# Patient Record
Sex: Female | Born: 1970
Health system: Southern US, Community
[De-identification: ages and names within clinical notes are randomized; demographics above are authoritative.]

## PROBLEM LIST (undated history)

## (undated) DIAGNOSIS — B20 Human immunodeficiency virus [HIV] disease: Secondary | ICD-10-CM

## (undated) DIAGNOSIS — N19 Unspecified kidney failure: Secondary | ICD-10-CM

## (undated) DIAGNOSIS — I82409 Acute embolism and thrombosis of unspecified deep veins of unspecified lower extremity: Secondary | ICD-10-CM

## (undated) DIAGNOSIS — I10 Essential (primary) hypertension: Secondary | ICD-10-CM

## (undated) HISTORY — PX: KIDNEY TRANSPLANT: SHX239

## (undated) HISTORY — PX: SKIN GRAFT: SHX250

---

## 2014-08-21 ENCOUNTER — Encounter (HOSPITAL_BASED_OUTPATIENT_CLINIC_OR_DEPARTMENT_OTHER): Payer: Self-pay | Admitting: *Deleted

## 2014-08-21 ENCOUNTER — Emergency Department (HOSPITAL_BASED_OUTPATIENT_CLINIC_OR_DEPARTMENT_OTHER): Payer: No Typology Code available for payment source

## 2014-08-21 ENCOUNTER — Emergency Department (HOSPITAL_BASED_OUTPATIENT_CLINIC_OR_DEPARTMENT_OTHER)
Admission: EM | Admit: 2014-08-21 | Discharge: 2014-08-21 | Disposition: A | Discharge status: Home / Self Care | Payer: No Typology Code available for payment source | Attending: Emergency Medicine | Admitting: Emergency Medicine

## 2014-08-21 DIAGNOSIS — Z87448 Personal history of other diseases of urinary system: Secondary | ICD-10-CM | POA: Insufficient documentation

## 2014-08-21 DIAGNOSIS — S6991XA Unspecified injury of right wrist, hand and finger(s), initial encounter: Secondary | ICD-10-CM | POA: Insufficient documentation

## 2014-08-21 DIAGNOSIS — Y998 Other external cause status: Secondary | ICD-10-CM | POA: Diagnosis not present

## 2014-08-21 DIAGNOSIS — S4991XA Unspecified injury of right shoulder and upper arm, initial encounter: Secondary | ICD-10-CM | POA: Insufficient documentation

## 2014-08-21 DIAGNOSIS — Y92410 Unspecified street and highway as the place of occurrence of the external cause: Secondary | ICD-10-CM | POA: Insufficient documentation

## 2014-08-21 DIAGNOSIS — Z79899 Other long term (current) drug therapy: Secondary | ICD-10-CM | POA: Insufficient documentation

## 2014-08-21 DIAGNOSIS — T148 Other injury of unspecified body region: Secondary | ICD-10-CM | POA: Insufficient documentation

## 2014-08-21 DIAGNOSIS — Y9389 Activity, other specified: Secondary | ICD-10-CM | POA: Diagnosis not present

## 2014-08-21 DIAGNOSIS — Z880 Allergy status to penicillin: Secondary | ICD-10-CM | POA: Insufficient documentation

## 2014-08-21 DIAGNOSIS — Z21 Asymptomatic human immunodeficiency virus [HIV] infection status: Secondary | ICD-10-CM | POA: Insufficient documentation

## 2014-08-21 DIAGNOSIS — I10 Essential (primary) hypertension: Secondary | ICD-10-CM | POA: Insufficient documentation

## 2014-08-21 DIAGNOSIS — T148XXA Other injury of unspecified body region, initial encounter: Secondary | ICD-10-CM

## 2014-08-21 DIAGNOSIS — S299XXA Unspecified injury of thorax, initial encounter: Secondary | ICD-10-CM | POA: Diagnosis present

## 2014-08-21 HISTORY — DX: Unspecified kidney failure: N19

## 2014-08-21 HISTORY — DX: Human immunodeficiency virus (HIV) disease: B20

## 2014-08-21 HISTORY — DX: Essential (primary) hypertension: I10

## 2014-08-21 MED ORDER — ACETAMINOPHEN 325 MG PO TABS
ORAL_TABLET | ORAL | Status: AC
  Administered 2014-08-21: 650 mg via ORAL
  Filled 2014-08-21: qty 2

## 2014-08-21 MED ORDER — TRAMADOL HCL 50 MG PO TABS: 50.0000 mg | ORAL_TABLET | Freq: Four times a day (QID) | ORAL | Status: DC | PRN

## 2014-08-21 MED ORDER — METHOCARBAMOL 500 MG PO TABS
1000.0000 mg | ORAL_TABLET | Freq: Once | ORAL | Status: AC
  Administered 2014-08-21: 1000 mg via ORAL
  Filled 2014-08-21: qty 2

## 2014-08-21 MED ORDER — NAPROXEN 250 MG PO TABS
500.0000 mg | ORAL_TABLET | Freq: Once | ORAL | Status: DC
  Filled 2014-08-21: qty 2

## 2014-08-21 MED ORDER — ACETAMINOPHEN 325 MG PO TABS
650.0000 mg | ORAL_TABLET | Freq: Once | ORAL | Status: AC
  Administered 2014-08-21: 650 mg via ORAL

## 2014-08-21 MED ORDER — METHOCARBAMOL 500 MG PO TABS: 500.0000 mg | ORAL_TABLET | Freq: Two times a day (BID) | ORAL | Status: DC
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
## 2014-08-21 NOTE — Discharge Instructions (Signed)
Cryotherapy °Cryotherapy means treatment with cold. Ice or gel packs can be used to reduce both pain and swelling. Ice is the most helpful within the first 24 to 48 hours after an injury or flare-up from overusing a muscle or joint. Sprains, strains, spasms, burning pain, shooting pain, and aches can all be eased with ice. Ice can also be used when recovering from surgery. Ice is effective, has very few side effects, and is safe for most people to use. °PRECAUTIONS  °Ice is not a safe treatment option for people with: °· Raynaud phenomenon. This is a condition affecting small blood vessels in the extremities. Exposure to cold may cause your problems to return. °· Cold hypersensitivity. There are many forms of cold hypersensitivity, including: °¨ Cold urticaria. Red, itchy hives appear on the skin when the tissues begin to warm after being iced. °¨ Cold erythema. This is a red, itchy rash caused by exposure to cold. °¨ Cold hemoglobinuria. Red blood cells break down when the tissues begin to warm after being iced. The hemoglobin that carry oxygen are passed into the urine because they cannot combine with blood proteins fast enough. °· Numbness or altered sensitivity in the area being iced. °If you have any of the following conditions, do not use ice until you have discussed cryotherapy with your caregiver: °· Heart conditions, such as arrhythmia, angina, or chronic heart disease. °· High blood pressure. °· Healing wounds or open skin in the area being iced. °· Current infections. °· Rheumatoid arthritis. °· Poor circulation. °· Diabetes. °Ice slows the blood flow in the region it is applied. This is beneficial when trying to stop inflamed tissues from spreading irritating chemicals to surrounding tissues. However, if you expose your skin to cold temperatures for too long or without the proper protection, you can damage your skin or nerves. Watch for signs of skin damage due to cold. °HOME CARE INSTRUCTIONS °Follow  these tips to use ice and cold packs safely. °· Place a dry or damp towel between the ice and skin. A damp towel will cool the skin more quickly, so you may need to shorten the time that the ice is used. °· For a more rapid response, add gentle compression to the ice. °· Ice for no more than 10 to 20 minutes at a time. The bonier the area you are icing, the less time it will take to get the benefits of ice. °· Check your skin after 5 minutes to make sure there are no signs of a poor response to cold or skin damage. °· Rest 20 minutes or more between uses. °· Once your skin is numb, you can end your treatment. You can test numbness by very lightly touching your skin. The touch should be so light that you do not see the skin dimple from the pressure of your fingertip. When using ice, most people will feel these normal sensations in this order: cold, burning, aching, and numbness. °· Do not use ice on someone who cannot communicate their responses to pain, such as small children or people with dementia. °HOW TO MAKE AN ICE PACK °Ice packs are the most common way to use ice therapy. Other methods include ice massage, ice baths, and cryosprays. Muscle creams that cause a cold, tingly feeling do not offer the same benefits that ice offers and should not be used as a substitute unless recommended by your caregiver. °To make an ice pack, do one of the following: °· Place crushed ice or a   bag of frozen vegetables in a sealable plastic bag. Squeeze out the excess air. Place this bag inside another plastic bag. Slide the bag into a pillowcase or place a damp towel between your skin and the bag. °· Mix 3 parts water with 1 part rubbing alcohol. Freeze the mixture in a sealable plastic bag. When you remove the mixture from the freezer, it will be slushy. Squeeze out the excess air. Place this bag inside another plastic bag. Slide the bag into a pillowcase or place a damp towel between your skin and the bag. °SEEK MEDICAL CARE  IF: °· You develop white spots on your skin. This may give the skin a blotchy (mottled) appearance. °· Your skin turns blue or pale. °· Your skin becomes waxy or hard. °· Your swelling gets worse. °MAKE SURE YOU:  °· Understand these instructions. °· Will watch your condition. °· Will get help right away if you are not doing well or get worse. °Document Released: 10/06/2010 Document Revised: 06/26/2013 Document Reviewed: 10/06/2010 °ExitCare® Patient Information ©2015 [employer]. This information is not intended to replace advice given to you by your health care provider. Make sure you discuss any questions you have with your health care provider. ° °

## 2014-10-02 ENCOUNTER — Ambulatory Visit: Payer: No Typology Code available for payment source | Attending: Chiropractic Medicine | Admitting: Physical Therapy

## 2014-10-02 DIAGNOSIS — M545 Low back pain, unspecified: Secondary | ICD-10-CM
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
## 2014-10-02 NOTE — Patient Instructions (Signed)
Piriformis Stretch   Lying on back, pull right knee toward opposite shoulder. Hold _20___ seconds. Repeat _5___ times. Do _2___ sessions per day.  Hamstring Stretch   With other leg bent, foot flat, grasp right leg and slowly try to straighten knee. Hold _20___ seconds. Repeat _5___ times. Do _2___ sessions per day.

## 2014-10-09 ENCOUNTER — Ambulatory Visit: Payer: No Typology Code available for payment source | Admitting: Physical Therapy

## 2014-10-09 ENCOUNTER — Encounter: Payer: Self-pay | Admitting: Physical Therapy

## 2014-10-09 DIAGNOSIS — M545 Low back pain, unspecified: Secondary | ICD-10-CM

## 2014-10-09 NOTE — Patient Instructions (Signed)
  Strengthening: Resisted Extension   Hold tubing in both hands arm forward. Pull arms back,keeping elbows straight. Repeat _15___ times per set. Do __2__ sets per session. Do _2Scapular Retraction: Bilateral   Facing anchor, pull arms back, bringing shoulder blades together. Elbows bent 90 degrees.  Repeat __15__ times per set. Do __2__ sets per session. Do __2__ sessions per day.    ABDUCTION: Standing - Resistance Band (Active)   Stand, feet flat. Against yellow resistance band, lift right leg out to side. Complete __1_ sets of 15___ repetitions. Perform _2__ sessions per day.      Strengthening: Hip Extension - Resisted   With tubing around right ankle, face anchor and pull leg straight back. Repeat __15__ times per set. Do __1__ sets per session. Do _2___ sessions per day.   Copyright  VHI. All rights reserved.

## 2014-10-09 NOTE — Therapy (Signed)
[facility] [place] [address] [place], [place], [address] Phone: [phone]   Fax:  [phone]  Physical Therapy Treatment  Patient Details  Name: Sharon Watkins MRN: [id number] Date of Birth: 08-23-1970 Referring Provider:  Pete Glatter, DC  Encounter Date: 10/09/2014      PT End of Session - 10/09/14 1033    Visit Number 2   Date for PT Re-Evaluation 12/03/14   PT Start Time 1015   PT Stop Time 1045   PT Time Calculation (min) 30 min      Past Medical History  Diagnosis Date  . Hypertension   . HIV (human immunodeficiency virus infection)   . Renal failure     Past Surgical History  Procedure Laterality Date  . Kidney transplant    . Skin graft      There were no vitals filed for this visit.  Visit Diagnosis:  Bilateral low back pain without sciatica      Subjective Assessment - 10/09/14 1013    Subjective no pain today,doing pretty good                         [facility] Adult PT Treatment/Exercise - 10/09/14 0001    Exercises   Exercises Lumbar;Hand;Knee/Hip   Lumbar Exercises: Aerobic   Stationary Bike Nustep L 5 6 min   Lumbar Exercises: Machines for Strengthening   Cybex Lumbar Extension blue tband 2 sets 10   Other Lumbar Machine Exercise row and lat pull 20 # 2 sets 10   Knee/Hip Exercises: Standing   Hip ADduction Strengthening;Both;1 set;10 reps  red tband   Hip Extension Both;1 set;10 reps;Knee straight;Stengthening  red tband                  PT Short Term Goals - 10/02/14 1013    PT SHORT TERM GOAL #1   Title Pt will be ind with HEP   Time 2   Period Weeks   Status New           PT Long Term Goals - 10/02/14 1013    PT LONG TERM GOAL #1   Title Pt will decrease pain in LB by 50%   Time 8   Period Weeks   Status New   PT LONG TERM GOAL #2   Title Pt will increase R hip flex 5/5   Time 8   Period Weeks   Status New   PT LONG TERM GOAL  #3   Title Pt will be able to unscrew water bottle with right hand   Time 8   Period Weeks   Status New   PT LONG TERM GOAL #4   Title Pt will be able to wash dishes and cook with right hand   Time 8   Period Weeks   Status New               Plan - 10/09/14 1033    Clinical Impression Statement pt with no pain and no increased pain with ther ex. trail without modalities d/t no pain. issued HEP for strength   PT Next Visit Plan work on flexibility and lumbar strength, thumb ROM        Problem List There are no active problems to display for this patient.   Pama Roskos,ANGIE PTA 10/09/2014, 10:34 AM  [facility]- [place] [address] [place], [place], [address] Phone: [phone]  Fax:  [phone]

## 2014-10-11 ENCOUNTER — Ambulatory Visit: Payer: No Typology Code available for payment source | Admitting: Physical Therapy

## 2014-10-11 ENCOUNTER — Encounter: Payer: Self-pay | Admitting: Physical Therapy

## 2014-10-11 DIAGNOSIS — M545 Low back pain, unspecified: Secondary | ICD-10-CM

## 2014-10-11 NOTE — Therapy (Deleted)
[facility] [place] [address] [place], [place], [address] Phone: [phone]   Fax:  [phone]  October 11, 2014   @  Physical Therapy Discharge Summary  Patient: Sharon Watkins  MRN: [id number]  Date of Birth: 1970/03/21   Diagnosis: Bilateral low back pain without sciatica Referring Provider:  Pete Glatter, DC  The above patient had been seen in Physical Therapy *** times of *** treatments scheduled with *** no shows and *** cancellations.  The treatment consisted of *** The patient is: {improved/worse/unchanged:[id number]}  Subjective: ***  Discharge Findings: ***  Functional Status at Discharge: ***  {[id number]}      Plan - 10/11/14 1040    Clinical Impression Statement pt tolerated therapy session well, decreased pain to 4/10 after ther ex and pt declined any modalities. issued cerv HEP and pt demo. pt seeing Dr Mauri Reading next week .   PT Next Visit Plan pt seeing Dr Mauri Reading next week then will f/u with PT      Sincerely,   Suanne Marker, PTA   CC @  [facility] [place] [address] [place], [place], [address] Phone: [phone]   Fax:  [phone]

## 2014-10-11 NOTE — Therapy (Addendum)
[place], [place], [address] Phone: [phone]   Fax:  [phone]  Physical Therapy Treatment  Patient Details  Name: Sharon Watkins MRN: [id number] Date of Birth: November 22, 1970 Referring Provider:  Reinaldo Meeker, DC  Encounter Date: 10/11/2014      PT End of Session - 10/11/14 1039    Visit Number 3   Date for PT Re-Evaluation 12/03/14   PT Start Time 1010   PT Stop Time 1050   PT Time Calculation (min) 40 min      Past Medical History  Diagnosis Date  . Hypertension   . HIV (human immunodeficiency virus infection)   . Renal failure     Past Surgical History  Procedure Laterality Date  . Kidney transplant    . Skin graft      There were no vitals filed for this visit.  Visit Diagnosis:  Bilateral low back pain without sciatica      Subjective Assessment - 10/11/14 1009    Subjective back is good, neck hurts for some reason   Currently in Pain? Yes   Pain Score 7    Pain Location Neck   Pain Orientation Left                         [facility] Adult PT Treatment/Exercise - 10/11/14 0001    Exercises   Exercises Neck   Neck Exercises: Machines for Strengthening   UBE (Upper Arm Bike) 2 fwd/2 back   Other Machines for Strengthening 3# shruggs, rolls and scap squeeze 2 sets 10   Neck Exercises: Standing   Neck Retraction 10 reps;3 secs   Other Standing Exercises ball vs wall 5 times CC and CW   Lumbar Exercises: Aerobic   Stationary Bike Nustep L 5 6 min   Lumbar Exercises: Machines for Strengthening   Other Lumbar Machine Exercise row and lat pull 20 # 2 sets 10   Manual Therapy   Manual Therapy Soft tissue mobilization   Soft tissue mobilization RT thumb                PT Education - 10/11/14 1013    Education provided Yes   Education Details ball squeezes for grip/thumb. cerv retraction, shruggs,scap squeeze and rolls   Methods  Explanation;Demonstration;Handout   Comprehension Verbalized understanding          PT Short Term Goals - 10/11/14 1041    PT SHORT TERM GOAL #1   Title Pt will be ind with HEP   Status Achieved           PT Long Term Goals - 10/11/14 1042    PT LONG TERM GOAL #1   Title Pt will decrease pain in LB by 50%   Status On-going   PT LONG TERM GOAL #2   Title Pt will increase R hip flex 5/5   Status On-going   PT LONG TERM GOAL #3   Title Pt will be able to unscrew water bottle with right hand   Status On-going   PT LONG TERM GOAL #4   Title Pt will be able to wash dishes and cook with right hand   Status On-going               Plan - 10/11/14 1040    Clinical Impression Statement pt tolerated therapy session well, decreased pain to 4/10 after ther ex and pt declined any modalities.  issued cerv HEP and pt demo. pt seeing Dr Berta Minor next week .   PT Next Visit Plan pt seeing Dr Berta Minor next week then will f/u with PT     PHYSICAL THERAPY DISCHARGE SUMMARY   Plan: Patient agrees to discharge.  Patient goals were met. Patient is being discharged due to meeting the stated rehab goals.  ?????        Problem List There are no active problems to display for this patient.   PAYSEUR,ANGIE PTA 10/11/2014, 10:45 AM  [place], [place], [address] Phone: [phone]   Fax:  [phone]

## 2016-02-05 ENCOUNTER — Emergency Department (HOSPITAL_BASED_OUTPATIENT_CLINIC_OR_DEPARTMENT_OTHER)
Admission: EM | Admit: 2016-02-05 | Discharge: 2016-02-05 | Disposition: A | Discharge status: Home / Self Care | Payer: Medicare Other | Attending: Emergency Medicine | Admitting: Emergency Medicine

## 2016-02-05 ENCOUNTER — Encounter (HOSPITAL_BASED_OUTPATIENT_CLINIC_OR_DEPARTMENT_OTHER): Payer: Self-pay | Admitting: *Deleted

## 2016-02-05 DIAGNOSIS — Z79899 Other long term (current) drug therapy: Secondary | ICD-10-CM | POA: Diagnosis not present

## 2016-02-05 DIAGNOSIS — Y9389 Activity, other specified: Secondary | ICD-10-CM | POA: Diagnosis not present

## 2016-02-05 DIAGNOSIS — Z21 Asymptomatic human immunodeficiency virus [HIV] infection status: Secondary | ICD-10-CM | POA: Diagnosis not present

## 2016-02-05 DIAGNOSIS — S199XXA Unspecified injury of neck, initial encounter: Secondary | ICD-10-CM | POA: Diagnosis present

## 2016-02-05 DIAGNOSIS — I10 Essential (primary) hypertension: Secondary | ICD-10-CM | POA: Insufficient documentation

## 2016-02-05 DIAGNOSIS — Y92410 Unspecified street and highway as the place of occurrence of the external cause: Secondary | ICD-10-CM | POA: Diagnosis not present

## 2016-02-05 DIAGNOSIS — Y999 Unspecified external cause status: Secondary | ICD-10-CM | POA: Diagnosis not present

## 2016-02-05 DIAGNOSIS — S161XXA Strain of muscle, fascia and tendon at neck level, initial encounter: Secondary | ICD-10-CM | POA: Diagnosis not present

## 2016-02-05 HISTORY — DX: Acute embolism and thrombosis of unspecified deep veins of unspecified lower extremity: I82.409

## 2016-02-05 MED ORDER — METHOCARBAMOL 500 MG PO TABS: 1000.0000 mg | ORAL_TABLET | Freq: Four times a day (QID) | ORAL | 0 refills | Status: AC

## 2016-02-05 MED FILL — METHOCARBAMOL 500 MG TABLET: 500 | 3 days supply | Qty: 20 | Fill #0
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
## 2019-02-24 DEATH — deceased
